# Patient Record
Sex: Male | Born: 2000 | Race: Black or African American | Hispanic: No | Marital: Single | Smoking: Never smoker
Health system: Southern US, Community
[De-identification: ages and names within clinical notes are randomized; demographics above are authoritative.]

## PROBLEM LIST (undated history)

## (undated) HISTORY — PX: MENISCUS REPAIR: SHX5179

---
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
## 2019-01-02 ENCOUNTER — Other Ambulatory Visit: Payer: Self-pay

## 2019-01-02 DIAGNOSIS — Z20822 Contact with and (suspected) exposure to covid-19: Secondary | ICD-10-CM

## 2019-01-03 LAB — NOVEL CORONAVIRUS, NAA: SARS-CoV-2, NAA: NOT DETECTED

## 2019-09-30 ENCOUNTER — Other Ambulatory Visit: Payer: Self-pay | Admitting: Sports Medicine

## 2019-09-30 ENCOUNTER — Ambulatory Visit
Admission: RE | Admit: 2019-09-30 | Discharge: 2019-09-30 | Disposition: A | Discharge status: Home / Self Care | Payer: BC Managed Care – PPO | Source: Ambulatory Visit | Attending: Sports Medicine | Admitting: Sports Medicine

## 2019-09-30 ENCOUNTER — Ambulatory Visit
Admission: RE | Admit: 2019-09-30 | Discharge: 2019-09-30 | Disposition: A | Discharge status: Home / Self Care | Payer: BC Managed Care – PPO | Attending: Sports Medicine | Admitting: Sports Medicine

## 2019-09-30 DIAGNOSIS — R102 Pelvic and perineal pain: Secondary | ICD-10-CM | POA: Diagnosis not present

## 2019-09-30 DIAGNOSIS — R52 Pain, unspecified: Secondary | ICD-10-CM

## 2019-09-30 DIAGNOSIS — M25552 Pain in left hip: Secondary | ICD-10-CM | POA: Insufficient documentation

## 2019-09-30 DIAGNOSIS — M8588 Other specified disorders of bone density and structure, other site: Secondary | ICD-10-CM | POA: Diagnosis not present

## 2020-11-03 IMAGING — CR DG HIP (WITH OR WITHOUT PELVIS) 2-3V*L*
1 series · 2 of 2 positions shown · non-contrast
Comparison: None.

CLINICAL DATA: Hip pain

EXAM:
DG HIP (WITH OR WITHOUT PELVIS) 2-3V LEFT

[Series 1: dg hip unilat w or w/o pelvis 2-3 views  · non-contrast · 0.14mm/px · 2 of 2 slices shown]
[im 1/2]
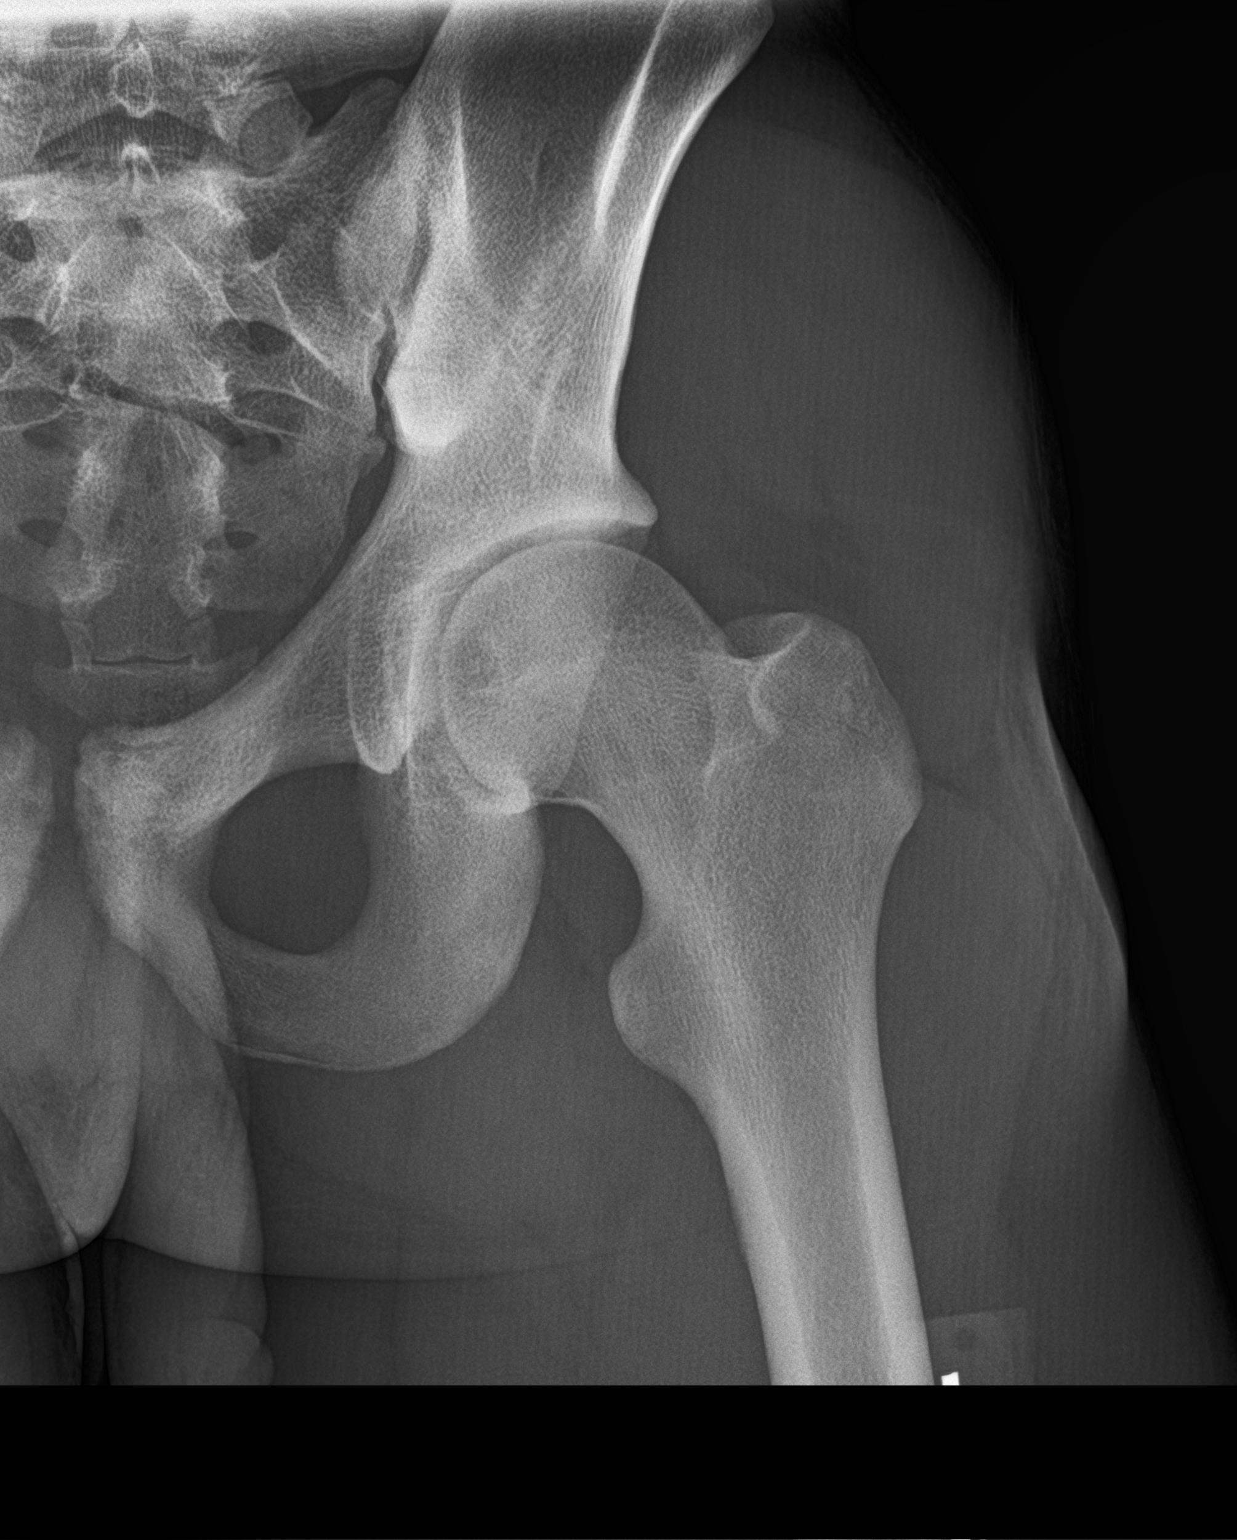
[im 2/2]
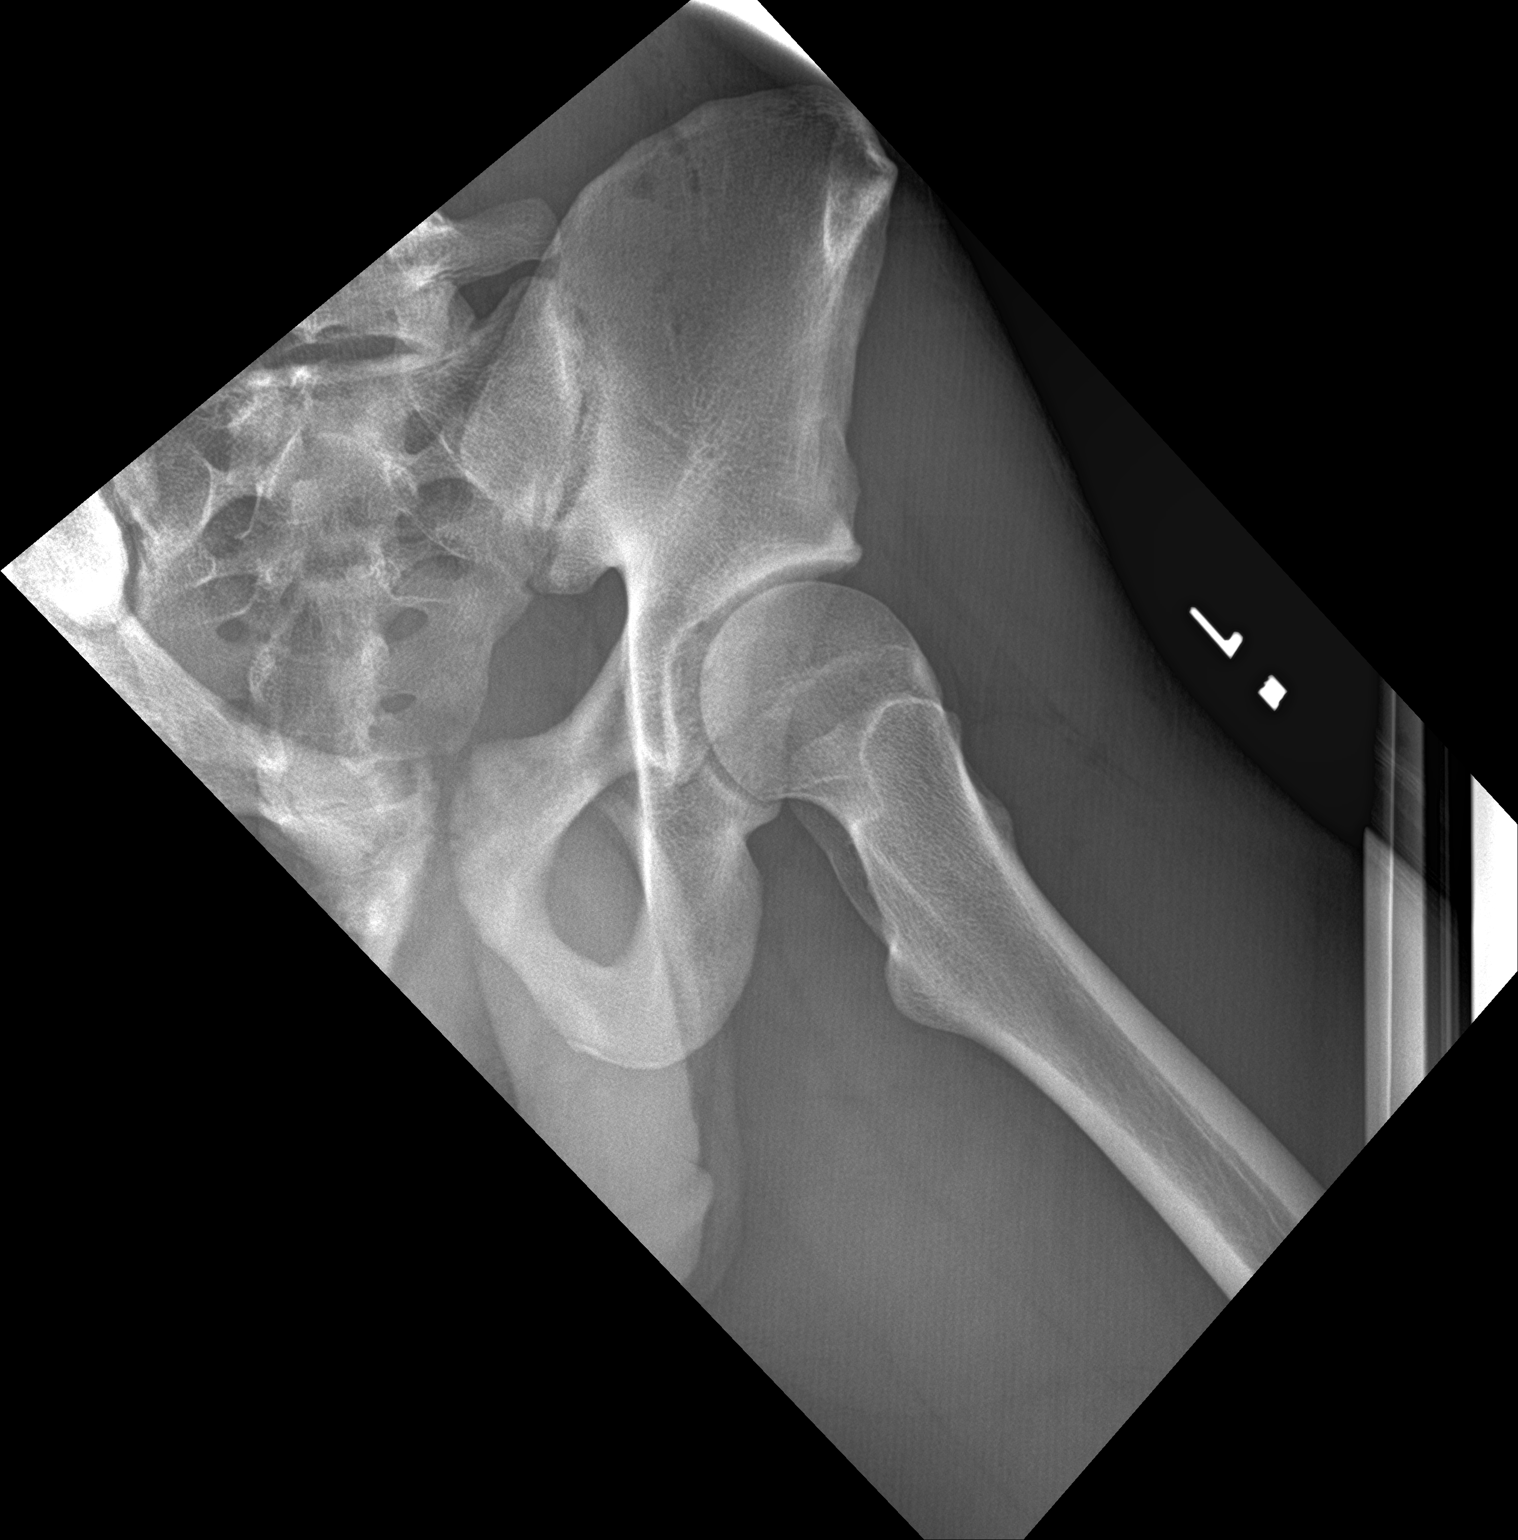

[2 of 2 positions shown; findings below may reference images not displayed]

FINDINGS: Alignment is anatomic. No acute fracture. Joint spaces are
preserved. Normal osseous mineralization. No intrinsic osseous
lesion. Sacroiliac joints are unremarkable.
IMPRESSION: Negative.

## 2020-11-03 IMAGING — CR DG HIP (WITH OR WITHOUT PELVIS) 2-3V*R*
1 series · 3 of 3 positions shown · non-contrast
Comparison: No prior.

CLINICAL DATA: Right hip and groin pain.

EXAM:
DG HIP (WITH OR WITHOUT PELVIS) 2-3V RIGHT

[Series 1: dg hip unilat w or w/o pelvis 2-3 views  · non-contrast · 0.14mm/px · 3 of 3 slices shown]
[im 1/3]
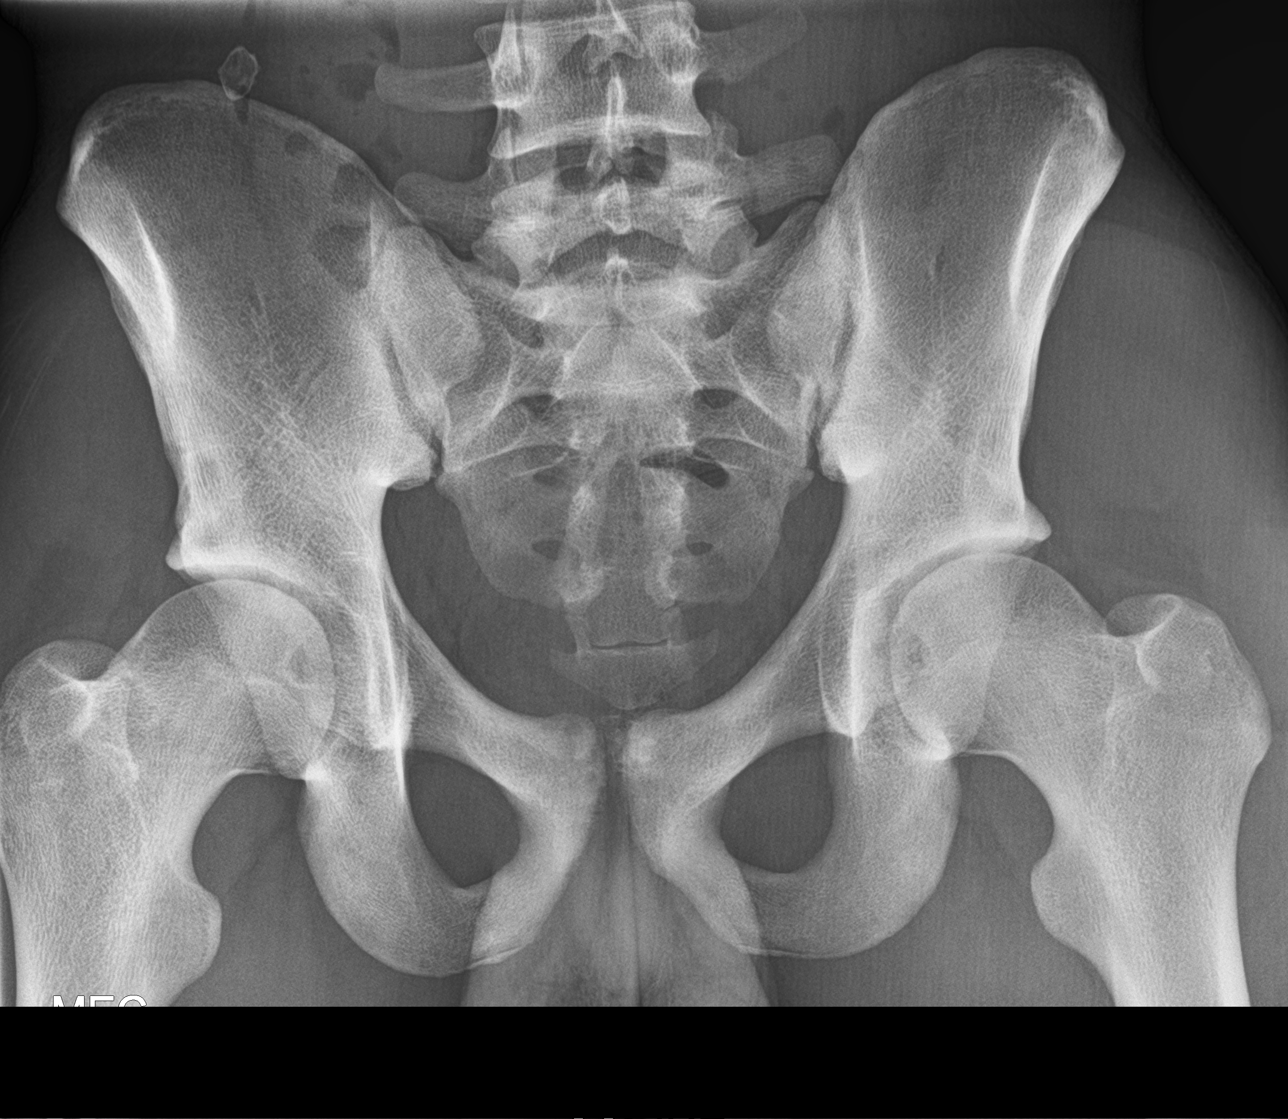
[im 2/3]
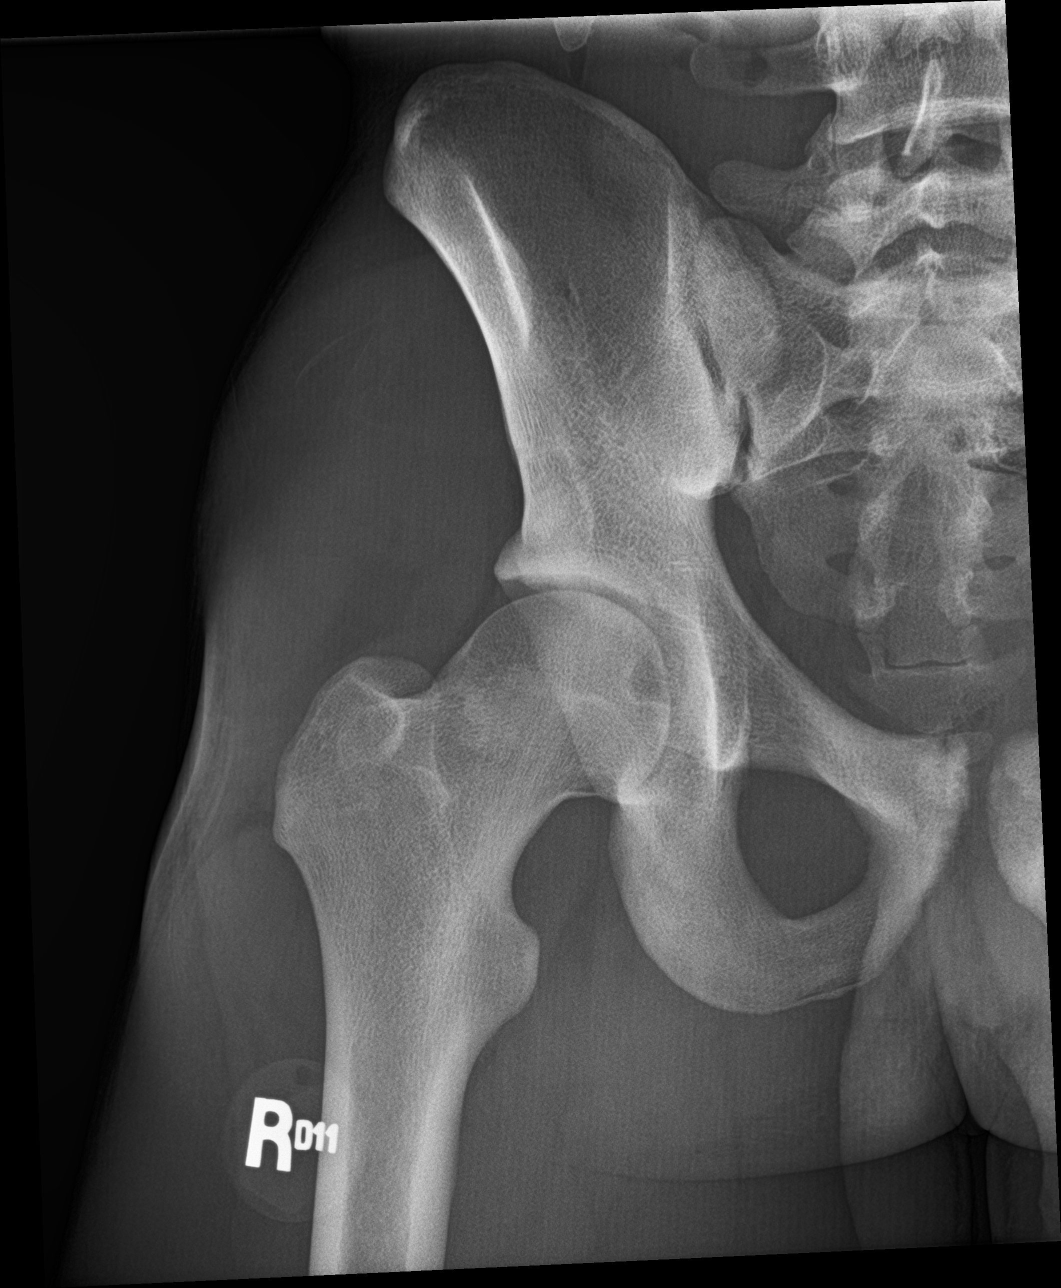
[im 3/3]
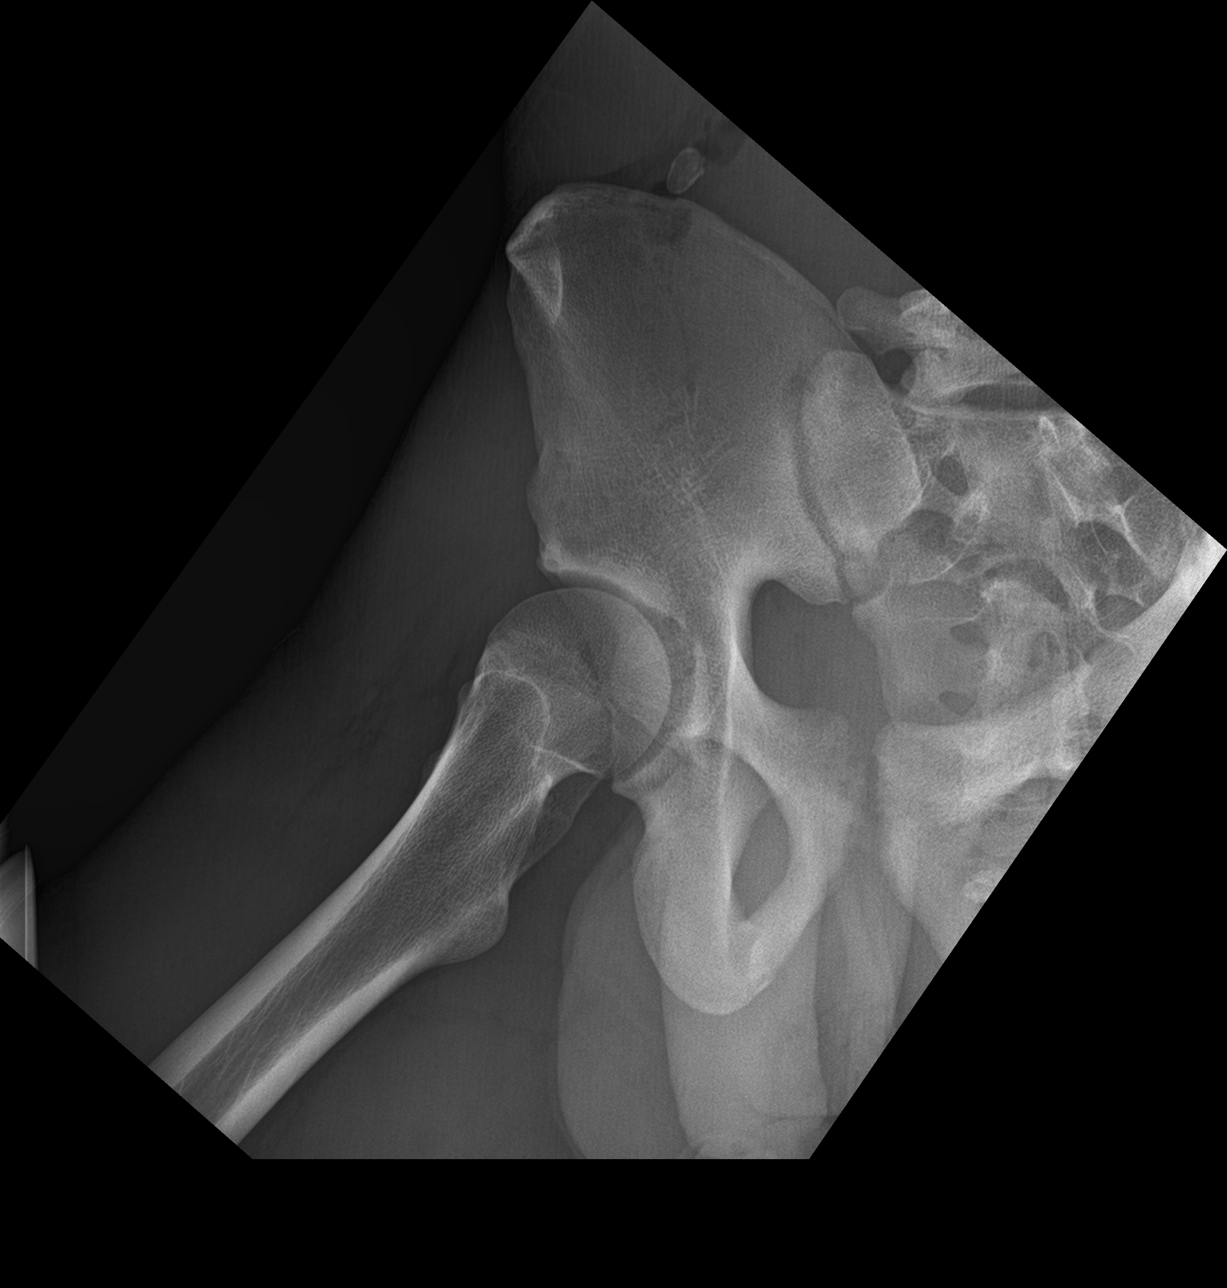

[3 of 3 positions shown; findings below may reference images not displayed]

FINDINGS: No acute bony or joint abnormality identified. No evidence of
fracture or dislocation. No significant arthropathy noted. Ossified
density noted over the upper right buttock/lower back. This may be
from prior injury. The possibility of an appendicolith cannot be
completely excluded.
IMPRESSION: 1.  No acute bony abnormality.

2. Ossified density noted over the right buttock/lower back. This
may be from prior injury. The possibly of an appendicolith cannot be
completely excluded.

## 2021-10-27 ENCOUNTER — Ambulatory Visit: Payer: BC Managed Care – PPO | Admitting: Adult Health

## 2021-10-27 ENCOUNTER — Encounter: Payer: Self-pay | Admitting: Adult Health

## 2021-10-27 VITALS — BP 112/70 | HR 96 | Temp 98.2°F | Ht 73.0 in | Wt 180.0 lb

## 2021-10-27 DIAGNOSIS — K007 Teething syndrome: Secondary | ICD-10-CM

## 2021-10-27 NOTE — Progress Notes (Signed)
[facility] [address] [place], [place] [address] Phone: [phone] Fax: [phone]   Office Visit Note  Patient Name: Eric Lindsey  Date of Birth:2000/10/06  Med Rec number [id number]  Date of Service: 10/27/2021  Eric Lindsey, Fruit & vegetable daily [nutritional supplements], Peanut-containing drug products, and Shellfish allergy  Chief Complaint  Patient presents with   Injury    Pt stated he has a cut in the back of his mouth. Noticed it a week ago. Pain has gone down a lot since he has been doing saltwater rinses. Still a little tender.     Injury    Patient reports he noticed a cut/ sore area posterior to his lower right back molar.  His trainer sees a "flap" and told him to have it looked at.  He reports he has been swishing salt water and the pain has essentially resolved.   Current Medication:  No outpatient encounter medications on file as of 10/27/2021.   No facility-administered encounter medications on file as of 10/27/2021.      Medical History: No past medical history on file.   Vital Signs: BP 112/70   Pulse 96   Temp 98.2 F (36.8 C) (Tympanic)   Ht 6\' 1"  (1.854 m)   Wt 180 lb (81.6 kg)   SpO2 99%   BMI 23.75 kg/m    Review of Systems  Constitutional:  Negative for fatigue and fever.  HENT:         Sore behind right lower molar.     Physical Exam Vitals and nursing note reviewed.  Constitutional:      Appearance: Normal appearance.  HENT:     Mouth/Throat:      Comments: Appears to be tissue flap as above on diagram.  Possible wisdom tooth eruption. No S/S of infection. Neurological:     Mental Status: He is alert.    Assessment/Plan: 1. Tooth eruption Encouraged patient to continue salt water gargles, and see his dentist for confirmation/evaluation. Follow up as needed.      General Counseling: Eric Lindsey verbalizes understanding of the findings of todays visit and agrees with plan of treatment. I have discussed any  further diagnostic evaluation that may be needed or ordered today. We also reviewed his medications today. he has been encouraged to call the office with any questions or concerns that should arise related to todays visit.   No orders of the defined types were placed in this encounter.   No orders of the defined types were placed in this encounter.   Time spent:20 Minutes    AGNP-C Nurse Practitioner

## 2022-03-15 ENCOUNTER — Encounter: Payer: Self-pay | Admitting: Oncology

## 2022-03-15 ENCOUNTER — Ambulatory Visit (INDEPENDENT_AMBULATORY_CARE_PROVIDER_SITE_OTHER): Payer: BC Managed Care – PPO | Admitting: Oncology

## 2022-03-15 ENCOUNTER — Other Ambulatory Visit: Payer: Self-pay

## 2022-03-15 ENCOUNTER — Ambulatory Visit
Admission: RE | Admit: 2022-03-15 | Discharge: 2022-03-15 | Disposition: A | Discharge status: Home / Self Care | Payer: BC Managed Care – PPO | Source: Ambulatory Visit | Attending: Oncology | Admitting: Oncology

## 2022-03-15 ENCOUNTER — Ambulatory Visit
Admission: RE | Admit: 2022-03-15 | Discharge: 2022-03-15 | Disposition: A | Discharge status: Home / Self Care | Payer: BC Managed Care – PPO | Attending: Oncology | Admitting: Oncology

## 2022-03-15 VITALS — BP 128/70 | HR 88 | Temp 99.0°F | Resp 18 | Ht 73.0 in | Wt 178.0 lb

## 2022-03-15 DIAGNOSIS — M545 Low back pain, unspecified: Secondary | ICD-10-CM

## 2022-03-15 DIAGNOSIS — R1084 Generalized abdominal pain: Secondary | ICD-10-CM | POA: Diagnosis not present

## 2022-03-15 NOTE — Progress Notes (Signed)
[facility] [address] [place], [place] [address] Phone: [phone] Fax: [phone]   Office Visit Note  Patient Name: Eric Lindsey  Date of Birth:11/21/2000  Med Rec number [id number]  Date of Service: 03/15/2022  Valentino Saxon, Fruit & vegetable daily [nutritional supplements], Peanut-containing drug products, and Shellfish allergy  No chief complaint on file.  Patient is an 21 y.o. student here for back, neck and abdominal pain X 1 year.  States he is very active and plays football and may have injured himself in the past but no injury he can specifically remember.  States his pain is intermittent and worse at nighttime when he is lying still.  Reports occasional worsening with specific movements like nodding his head and raising his arms above his head.  States when he is busy he does not feel it much.  Has not tried anything for his symptoms.  Also has concerns because his dad has only 1 kidney and states he does not know if the back pain is related to his kidneys or something different.   Reports no allergies to medications but has not food allergies.   Current Medication:  No outpatient encounter medications on file as of 03/15/2022.   No facility-administered encounter medications on file as of 03/15/2022.   Medical History: History reviewed. No pertinent past medical history.  Vital Signs: BP 128/70   Pulse 88   Temp 99 F (37.2 C) (Tympanic)   Resp 18   Ht 6\' 1"  (1.854 m)   Wt 178 lb (80.7 kg)   SpO2 99%   BMI 23.48 kg/m   ROS: As per HPI.  All other pertinent ROS negative.     Review of Systems  Respiratory:  Negative for cough and shortness of breath.   Gastrointestinal:  Positive for abdominal pain. Negative for diarrhea, nausea and vomiting.  Genitourinary:  Positive for flank pain.  Musculoskeletal:  Positive for back pain, myalgias and neck pain.  Neurological:  Negative for dizziness and headaches.    Physical Exam Vitals reviewed.   Constitutional:      Appearance: Normal appearance.  Abdominal:     General: Abdomen is flat. Bowel sounds are normal.     Palpations: Abdomen is soft. There is no hepatomegaly or splenomegaly.     Tenderness: There is no abdominal tenderness.     Comments: No reproducible abdominal pain  Musculoskeletal:     Cervical back: Normal, full passive range of motion without pain and normal range of motion. No swelling or bony tenderness. Muscular tenderness present. Normal range of motion.     Thoracic back: Normal. No deformity, tenderness or bony tenderness.     Lumbar back: Normal. No deformity or bony tenderness. Normal range of motion.     Comments: No obvious deformity on exam.  Mild tenderness to trapezius muscle on right side.  No reproducible abdominal pain  Lymphadenopathy:     Cervical: No cervical adenopathy.  Neurological:     Mental Status: He is alert.    No results found for this or any previous visit (from the past 24 hour(s)).  Assessment/Plan: 1. Acute bilateral low back pain without sciatica -Discussed trying NSAIDs ibuprofen/Motrin or Advil 600 mg every 8 hours to see if symptoms improved. -Please let me know if this worsens or fails to improve over the next week.   2. Generalized abdominal pain -Unclear etiology.  Unable to reproduce while in clinic today. -Discussed KUB abdominal x-ray to evaluate. -Imaging will be completed over  at outpatient imaging center at [facility].  Please let me know if you have any questions.  - DG Abd 1 View; Future   Disposition-return to clinic as needed  General Counseling: Kashton verbalizes understanding of the findings of todays visit and agrees with plan of treatment. I have discussed any further diagnostic evaluation that may be needed or ordered today. We also reviewed his medications today. he has been encouraged to call the office with any questions or concerns that should arise related to todays visit.   No orders  of the defined types were placed in this encounter.   No orders of the defined types were placed in this encounter.   I spent 20 minutes dedicated to the care of this patient (face-to-face and non-face-to-face) on the date of the encounter to include what is described in the assessment and plan.   Durenda Hurt, NP 03/15/2022 11:15 AM

## 2022-03-15 NOTE — Patient Instructions (Addendum)
You can have your abdominal x-ray completed over at outpatient imaging center which is located at [address] anytime from 8 AM till 5 PM Monday through Friday.  I will need to bring with you as her driver's license and insurance card.  The results will come to me and I will send them to you.  Make sure that you sign up for MyChart.   We discussed your back pain.  Take ibuprofen/Advil or Motrin 3 tabs every 8 hours to see if this helps for the next 3 to 4 days.  Please let me know if the symptoms persist via MyChart.  Durenda Hurt, NP 03/15/2022 11:29 AM

## 2022-03-17 ENCOUNTER — Encounter: Payer: Self-pay | Admitting: Oncology

## 2022-03-27 ENCOUNTER — Ambulatory Visit
Admission: EM | Admit: 2022-03-27 | Discharge: 2022-03-27 | Disposition: A | Discharge status: Home / Self Care | Payer: BC Managed Care – PPO | Attending: Emergency Medicine | Admitting: Emergency Medicine

## 2022-03-27 DIAGNOSIS — Z1152 Encounter for screening for COVID-19: Secondary | ICD-10-CM | POA: Insufficient documentation

## 2022-03-27 DIAGNOSIS — J069 Acute upper respiratory infection, unspecified: Secondary | ICD-10-CM | POA: Diagnosis present

## 2022-03-27 DIAGNOSIS — R051 Acute cough: Secondary | ICD-10-CM | POA: Diagnosis present

## 2022-03-27 DIAGNOSIS — R059 Cough, unspecified: Secondary | ICD-10-CM | POA: Insufficient documentation

## 2022-03-27 LAB — RESP PANEL BY RT-PCR (FLU A&B, COVID) ARPGX2
Influenza A by PCR: NEGATIVE
Influenza B by PCR: NEGATIVE
SARS Coronavirus 2 by RT PCR: NEGATIVE

## 2022-03-27 NOTE — ED Notes (Signed)
Patient established with pcp. New patient appointment scheduled for Wednesday February 2024 with Dr Margarita Mail.

## 2022-03-27 NOTE — ED Triage Notes (Signed)
Patient to Urgent Care with complaints of fatigue, nasal congestion, hot flashes and cold chills. Reports waking up two days ago symptoms.  Denies any known fevers.

## 2022-03-27 NOTE — Discharge Instructions (Addendum)
Your COVID and Flu tests are pending.    Take Tylenol or ibuprofen as needed for fever or discomfort.  Rest and keep yourself hydrated.    Follow-up with your primary care provider if your symptoms are not improving.     

## 2022-03-27 NOTE — ED Provider Notes (Signed)
[facility]    CSN: [id number] Arrival date & time: 03/27/22  1131      History   Chief Complaint Chief Complaint  Patient presents with   Nasal Congestion    HPI Kasper Mudrick is a 21 y.o. male.  Patient presents with 2-day history of runny nose, congestion, mild cough.  He denies fever, rash, sore throat, shortness of breath, vomiting, diarrhea, or other symptoms.  No OTC medications taken.  The history is provided by the patient.    History reviewed. No pertinent past medical history.  Patient Active Problem List   Diagnosis Date Noted   Cough 03/27/2022    Past Surgical History:  Procedure Laterality Date   MENISCUS REPAIR Right        Home Medications    Prior to Admission medications   Not on File    Family History No family history on file.  Social History Social History   Tobacco Use   Smoking status: Never   Smokeless tobacco: Never  Vaping Use   Vaping Use: Never used  Substance Use Topics   Alcohol use: Never   Drug use: Never     Allergies   Cherry, Fruit & vegetable daily [nutritional supplements], Peanut-containing drug products, and Shellfish allergy   Review of Systems Review of Systems  Constitutional:  Negative for chills and fever.  HENT:  Positive for congestion and rhinorrhea. Negative for ear pain and sore throat.   Respiratory:  Positive for cough. Negative for shortness of breath.   Cardiovascular:  Negative for chest pain and palpitations.  Gastrointestinal:  Negative for diarrhea and vomiting.  Skin:  Negative for color change and rash.  All other systems reviewed and are negative.    Physical Exam Triage Vital Signs ED Triage Vitals  Enc Vitals Group     BP 03/27/22 1423 117/72     Pulse Rate 03/27/22 1411 (!) 50     Resp 03/27/22 1411 18     Temp 03/27/22 1411 (!) 97.5 F (36.4 C)     Temp src --      SpO2 03/27/22 1411 98 %     Weight 03/27/22 1420 175 lb (79.4 kg)     Height 03/27/22 1420  6\' 1"  (1.854 m)     Head Circumference --      Peak Flow --      Pain Score 03/27/22 1414 0     Pain Loc --      Pain Edu? --      Excl. in GC? --    No data found.  Updated Vital Signs BP 117/72   Pulse (!) 50   Temp (!) 97.5 F (36.4 C)   Resp 18   Ht 6\' 1"  (1.854 m)   Wt 175 lb (79.4 kg)   SpO2 98%   BMI 23.09 kg/m   Visual Acuity Right Eye Distance:   Left Eye Distance:   Bilateral Distance:    Right Eye Near:   Left Eye Near:    Bilateral Near:     Physical Exam Vitals and nursing note reviewed.  Constitutional:      General: He is not in acute distress.    Appearance: Normal appearance. He is well-developed. He is not ill-appearing.  HENT:     Right Ear: Tympanic membrane normal.     Left Ear: Tympanic membrane normal.     Nose: Nose normal.     Mouth/Throat:     Mouth: Mucous membranes are  moist.     Pharynx: Oropharynx is clear.  Eyes:     Conjunctiva/sclera: Conjunctivae normal.  Cardiovascular:     Rate and Rhythm: Normal rate and regular rhythm.     Heart sounds: Normal heart sounds.  Pulmonary:     Effort: Pulmonary effort is normal. No respiratory distress.     Breath sounds: Normal breath sounds.  Musculoskeletal:     Cervical back: Neck supple.  Skin:    General: Skin is warm and dry.  Neurological:     Mental Status: He is alert.  Psychiatric:        Mood and Affect: Mood normal.        Behavior: Behavior normal.      UC Treatments / Results  Labs (all labs ordered are listed, but only abnormal results are displayed) Labs Reviewed  RESP PANEL BY RT-PCR (FLU A&B, COVID) [id number]    EKG   Radiology No results found.  Procedures Procedures (including critical care time)  Medications Ordered in UC Medications - No data to display  Initial Impression / Assessment and Plan / UC Course  I have reviewed the triage vital signs and the nursing notes.  Pertinent labs & imaging results that were available during my care of the  patient were reviewed by me and considered in my medical decision making (see chart for details).    Viral URI.  COVID and Flu pending.  Discussed symptomatic treatment including Tylenol or ibuprofen, rest, hydration.  Instructed patient to follow up with a PCP if symptoms are not improving.  Our nurse scheduled appointment for patient to establish with a PCP.  He agrees to plan of care.   Final Clinical Impressions(s) / UC Diagnoses   Final diagnoses:  Acute cough  Viral URI     Discharge Instructions      Your COVID and Flu tests are pending.    Take Tylenol or ibuprofen as needed for fever or discomfort.  Rest and keep yourself hydrated.    Follow-up with your primary care provider if your symptoms are not improving.         ED Prescriptions   None    PDMP not reviewed this encounter.   Mickie Bail, NP 03/27/22 1445

## 2022-05-18 ENCOUNTER — Ambulatory Visit: Payer: Self-pay | Admitting: Internal Medicine
